# Patient Record
Sex: Male | Born: 1980 | Race: White | Hispanic: No | Marital: Married | Smoking: Current some day smoker
Health system: Southern US, Community
[De-identification: ages and names within clinical notes are randomized; demographics above are authoritative.]

---

## 2019-04-06 ENCOUNTER — Emergency Department (HOSPITAL_BASED_OUTPATIENT_CLINIC_OR_DEPARTMENT_OTHER)
Admission: EM | Admit: 2019-04-06 | Discharge: 2019-04-07 | Disposition: A | Discharge status: Home / Self Care | Payer: 59 | Attending: Emergency Medicine | Admitting: Emergency Medicine

## 2019-04-06 ENCOUNTER — Emergency Department (HOSPITAL_BASED_OUTPATIENT_CLINIC_OR_DEPARTMENT_OTHER): Payer: 59

## 2019-04-06 ENCOUNTER — Encounter (HOSPITAL_BASED_OUTPATIENT_CLINIC_OR_DEPARTMENT_OTHER): Payer: Self-pay | Admitting: Emergency Medicine

## 2019-04-06 DIAGNOSIS — F17290 Nicotine dependence, other tobacco product, uncomplicated: Secondary | ICD-10-CM | POA: Diagnosis not present

## 2019-04-06 DIAGNOSIS — R002 Palpitations: Secondary | ICD-10-CM | POA: Insufficient documentation

## 2019-04-06 NOTE — ED Notes (Signed)
Pt. Reports he was wrapping presents and suddenly felt his heart beating inconsistently.  Pt. Reports he continued to feels this and felt he should come in.  Pt. Said he had some psychosomatic concern and some gas but no weakness and no nausea and no vomiting and no dizziness.

## 2019-04-06 NOTE — ED Provider Notes (Signed)
[facility] Provider Note   CSN: [id number] Arrival date & time: 04/06/19  2159     History Chief Complaint  Patient presents with  . Palpitations    Troy Campbell is a 38 y.o. male.  The history is provided by the patient.  Palpitations Palpitations quality: skipped beats. Onset quality:  At rest Timing:  Constant Progression:  Resolved Chronicity:  New Context: not anxiety, not appetite suppressants, not blood loss, not bronchodilators, not caffeine, not dehydration, not exercise, not hyperventilation and not illicit drugs   Context comment:  Wrapping presents  Relieved by:  Nothing Worsened by:  Nothing Ineffective treatments:  None tried Associated symptoms: no back pain, no chest pain, no chest pressure, no cough, no diaphoresis, no dizziness, no hemoptysis, no leg pain, no lower extremity edema, no malaise/fatigue, no nausea, no near-syncope, no numbness, no orthopnea, no PND, no shortness of breath, no syncope, no vomiting and no weakness   Risk factors: no diabetes mellitus, no heart disease and no hx of PE   Patient with no PMH no travel no leg pain, no covid exposures presents with feeling heart was skipping a beat while wrapping gifts this evening.  No exposures.  No home medications.  No travel.  No CP no SOB, no leg pain or swelling.  No f/c/r.       History reviewed. No pertinent past medical history.  There are no problems to display for this patient.   History reviewed. No pertinent surgical history.     Family History  Problem Relation Age of Onset  . Cancer Other     Social History   Tobacco Use  . Smoking status: Current Some Day Smoker    Types: Cigars  . Smokeless tobacco: Never Used  Substance Use Topics  . Alcohol use: Yes    Comment: infrequent   . Drug use: Never    Home Medications Prior to Admission medications   Not on File    Allergies    Patient has no known allergies.  Review of Systems    Review of Systems  Constitutional: Negative for diaphoresis, fever and malaise/fatigue.  HENT: Negative for congestion.   Eyes: Negative for visual disturbance.  Respiratory: Negative for cough, hemoptysis, chest tightness and shortness of breath.   Cardiovascular: Positive for palpitations. Negative for chest pain, orthopnea, leg swelling, syncope, PND and near-syncope.  Gastrointestinal: Negative for nausea and vomiting.  Genitourinary: Negative for difficulty urinating.  Musculoskeletal: Negative for back pain.  Neurological: Negative for dizziness, weakness and numbness.  Psychiatric/Behavioral: Negative for agitation.  All other systems reviewed and are negative.   Physical Exam Updated Vital Signs BP (!) 146/92 (BP Location: Left Arm)   Pulse 72   Temp 98.5 F (36.9 C) (Oral)   Resp 18   Ht 5\' 10"  (1.778 m)   Wt 95.7 kg   SpO2 99%   BMI 30.28 kg/m   Physical Exam Vitals and nursing note reviewed.  Constitutional:      General: He is not in acute distress.    Appearance: Normal appearance. He is not diaphoretic.  HENT:     Head: Normocephalic and atraumatic.     Nose: Nose normal.  Eyes:     Conjunctiva/sclera: Conjunctivae normal.     Pupils: Pupils are equal, round, and reactive to light.  Cardiovascular:     Rate and Rhythm: Normal rate and regular rhythm.     Pulses: Normal pulses.     Heart sounds: Normal  heart sounds.  Pulmonary:     Effort: Pulmonary effort is normal.     Breath sounds: Normal breath sounds.  Abdominal:     General: Abdomen is flat. Bowel sounds are normal.     Tenderness: There is no abdominal tenderness. There is no guarding or rebound.  Musculoskeletal:        General: No swelling or tenderness. Normal range of motion.     Cervical back: Normal range of motion and neck supple.     Right lower leg: No edema.     Left lower leg: No edema.  Skin:    General: Skin is warm and dry.     Capillary Refill: Capillary refill takes less  than 2 seconds.  Neurological:     General: No focal deficit present.     Mental Status: He is alert and oriented to person, place, and time.     Deep Tendon Reflexes: Reflexes normal.  Psychiatric:        Thought Content: Thought content normal.     ED Results / Procedures / Treatments   Labs (all labs ordered are listed, but only abnormal results are displayed) Labs Reviewed - No data to display  EKG EKG Interpretation  Date/Time:  Tuesday April 06 2019 22:28:32 EST Ventricular Rate:  70 PR Interval:    QRS Duration: 93 QT Interval:  400 QTC Calculation: 432 R Axis:   27 Text Interpretation: Sinus rhythm No previous ECGs available Confirmed by Vanetta MuldersZackowski, Scott [phone] on 04/06/2019 10:46:07 PM   Radiology No results found.  Procedures Procedures (including critical care time)  Medications Ordered in ED Medications - No data to display  ED Course  I have reviewed the triage vital signs and the nursing notes.  Pertinent labs & imaging results that were available during my care of the patient were reviewed by me and considered in my medical decision making (see chart for details).   PERC negative wells 0, highly doubt PE in this low risk patient.  These were not fast palpitations.  They were skipped beats. There was not chest, nor back nor flank pain and no shortness of breath.  He has had no travel or leg pain/swelling.  Electrolytes are normal.  EKG is normal and troponin is normal. Patient is feeling well and has had no further episodes.  He does not want to wait for a second troponin even after a discussion about the benefits.   Will refer to cardiology.    Troy Campbell was evaluated in Emergency Department on 04/07/2019 for the symptoms described in the history of present illness. He was evaluated in the context of the global COVID-19 pandemic, which necessitated consideration that the patient might be at risk for infection with the SARS-CoV-2 virus that causes  COVID-19. Institutional protocols and algorithms that pertain to the evaluation of patients at risk for COVID-19 are in a state of rapid change based on information released by regulatory bodies including the CDC and federal and state organizations. These policies and algorithms were followed during the patient's care in the ED.    Final Clinical Impression(s) / ED Diagnoses   Return for intractable cough, coughing up blood,fevers >100.4 unrelieved by medication, shortness of breath, intractable vomiting, chest pain, shortness of breath, weakness,numbness, changes in speech, facial asymmetry,abdominal pain, passing out,Inability to tolerate liquids or food, cough, altered mental status or any concerns. No signs of systemic illness or infection. The patient is nontoxic-appearing on exam and vital signs are within normal limits.  I have reviewed the triage vital signs and the nursing notes. Pertinent labs &imaging results that were available during my care of the patient were reviewed by me and considered in my medical decision making (see chart for details).  After history, exam, and medical workup I feel the patient has been appropriately medically screened and is safe for discharge home. Pertinent diagnoses were discussed with the patient. Patient was given return     Troy Whaling, MD 04/07/19 [id number]

## 2019-04-06 NOTE — ED Triage Notes (Signed)
Pt states he was watching a movie and started having heart palpitations  Pt states he could feel it and it would be like 2 or 3 beats then skip a beat

## 2019-04-06 NOTE — ED Notes (Signed)
ED Provider at bedside. 

## 2019-04-07 ENCOUNTER — Encounter (HOSPITAL_BASED_OUTPATIENT_CLINIC_OR_DEPARTMENT_OTHER): Payer: Self-pay | Admitting: Emergency Medicine

## 2019-04-07 LAB — CBC WITH DIFFERENTIAL/PLATELET
Abs Immature Granulocytes: 0.02 10*3/uL (ref 0.00–0.07)
Basophils Absolute: 0 10*3/uL (ref 0.0–0.1)
Basophils Relative: 0 %
Eosinophils Absolute: 0.2 10*3/uL (ref 0.0–0.5)
Eosinophils Relative: 2 %
HCT: 41.3 % (ref 39.0–52.0)
Hemoglobin: 14.6 g/dL (ref 13.0–17.0)
Immature Granulocytes: 0 %
Lymphocytes Relative: 45 %
Lymphs Abs: 3.3 10*3/uL (ref 0.7–4.0)
MCH: 31.7 pg (ref 26.0–34.0)
MCHC: 35.4 g/dL (ref 30.0–36.0)
MCV: 89.6 fL (ref 80.0–100.0)
Monocytes Absolute: 0.8 10*3/uL (ref 0.1–1.0)
Monocytes Relative: 10 %
Neutro Abs: 3.2 10*3/uL (ref 1.7–7.7)
Neutrophils Relative %: 43 %
Platelets: 184 10*3/uL (ref 150–400)
RBC: 4.61 MIL/uL (ref 4.22–5.81)
RDW: 11.9 % (ref 11.5–15.5)
WBC: 7.5 10*3/uL (ref 4.0–10.5)
nRBC: 0 % (ref 0.0–0.2)

## 2019-04-07 LAB — BASIC METABOLIC PANEL
Anion gap: 10 (ref 5–15)
BUN: 24 mg/dL — ABNORMAL HIGH (ref 6–20)
CO2: 23 mmol/L (ref 22–32)
Calcium: 9.3 mg/dL (ref 8.9–10.3)
Chloride: 106 mmol/L (ref 98–111)
Creatinine, Ser: 1.04 mg/dL (ref 0.61–1.24)
GFR calc Af Amer: >60 mL/min (ref >60)
GFR calc non Af Amer: >60 mL/min (ref >60)
Glucose, Bld: 110 mg/dL — ABNORMAL HIGH (ref 70–99)
Potassium: 3.9 mmol/L (ref 3.5–5.1)
Sodium: 139 mmol/L (ref 135–145)

## 2019-04-07 LAB — TROPONIN I (HIGH SENSITIVITY): Troponin I (High Sensitivity): <2 ng/L (ref <18)

## 2019-04-07 NOTE — ED Notes (Signed)
ED Provider at bedside. 

## 2019-10-04 ENCOUNTER — Telehealth: Payer: Self-pay | Admitting: Rheumatology

## 2019-10-04 NOTE — Telephone Encounter (Signed)
Patient is suppose to take second dose of Humira on Friday, but has not received it yet. Patient thinks he has missed a step in directions, and wants to touch base with you in regards to this. Please call to advise. °

## 2019-10-04 NOTE — Telephone Encounter (Signed)
Please disregard message. Attached to incorrect patient.

## 2019-10-04 NOTE — Telephone Encounter (Signed)
This is not a patient of [facility].

## 2021-07-27 IMAGING — DX DG CHEST 1V PORT
1 series · 1 of 1 positions shown · non-contrast
Comparison: None.

CLINICAL DATA: Palpitations.

EXAM:
PORTABLE CHEST 1 VIEW

[chest ap]
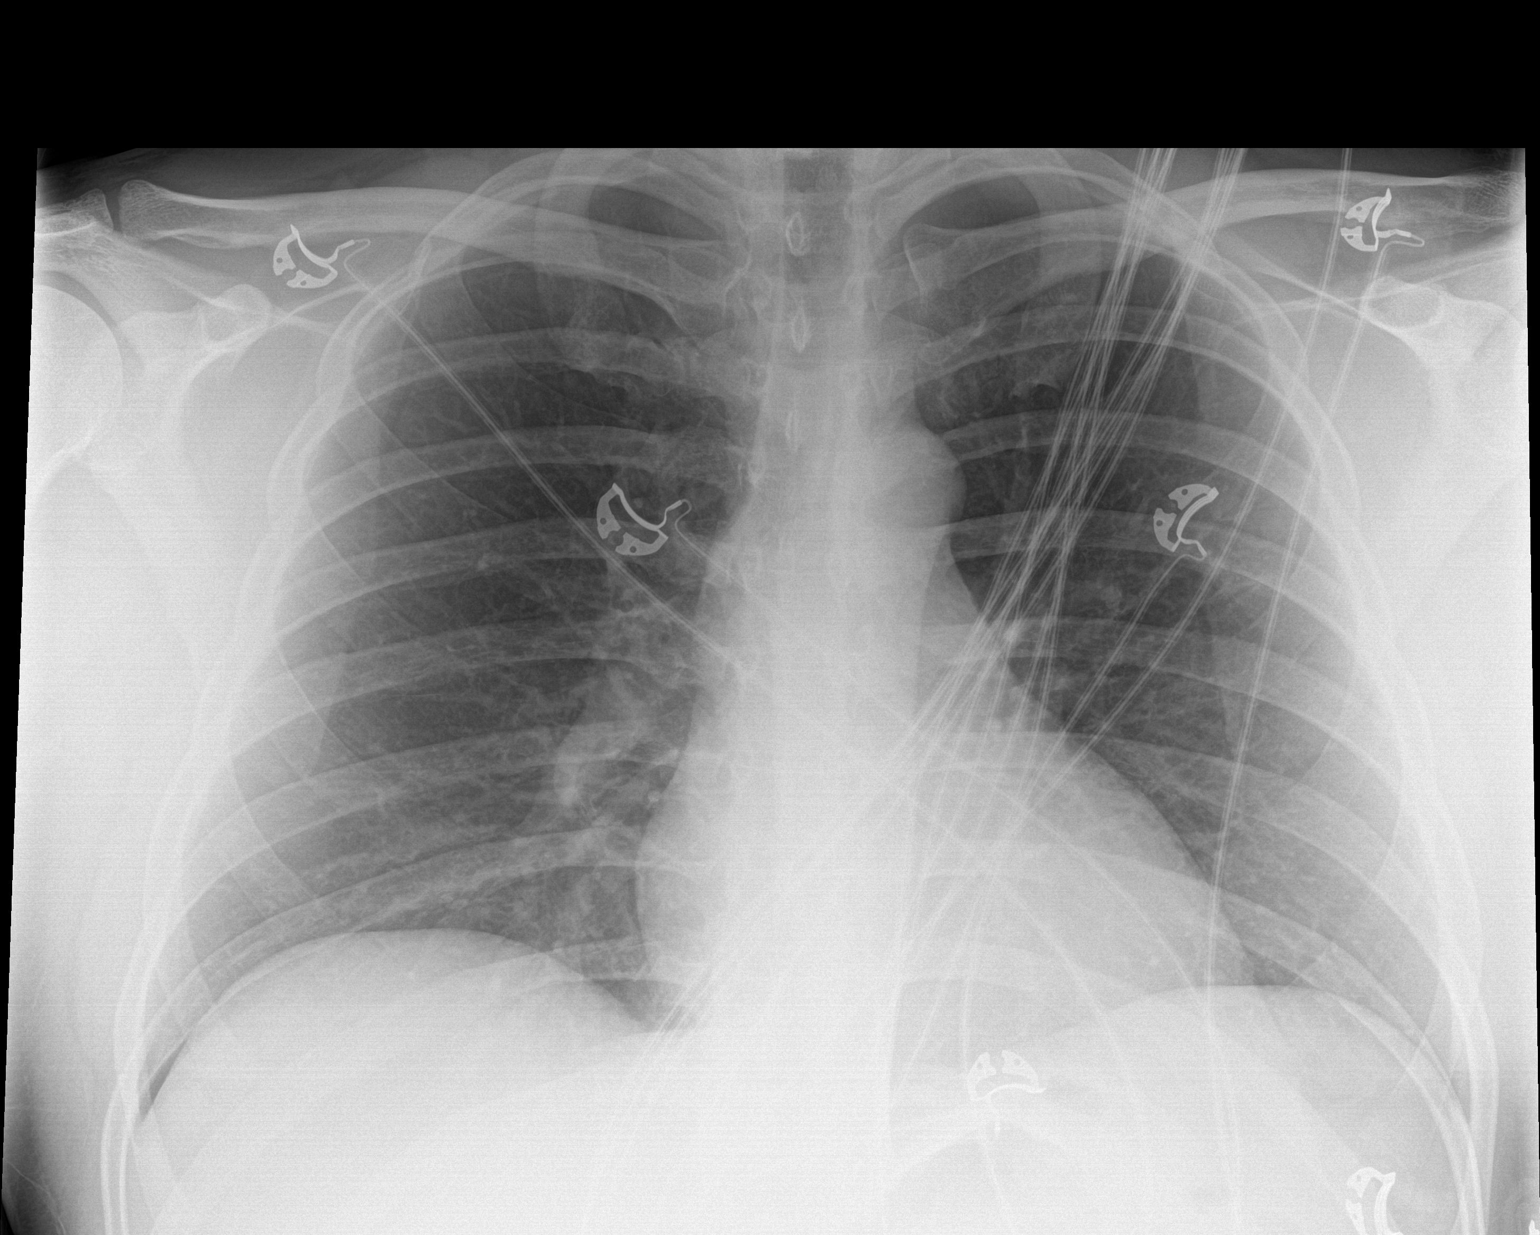

[1 of 1 positions shown; findings below may reference images not displayed]

FINDINGS: The cardiomediastinal contours are normal. The lungs are clear.
Pulmonary vasculature is normal. No consolidation, pleural effusion,
or pneumothorax. No acute osseous abnormalities are seen.
IMPRESSION: Negative AP view of the chest radiograph.
